# Patient Record
Sex: Male | Born: 1985 | Race: Black or African American | Hispanic: No | Marital: Single | Smoking: Current every day smoker
Health system: Southern US, Community
[De-identification: ages and names within clinical notes are randomized; demographics above are authoritative.]

## PROBLEM LIST (undated history)

## (undated) DIAGNOSIS — W3400XA Accidental discharge from unspecified firearms or gun, initial encounter: Secondary | ICD-10-CM

## (undated) HISTORY — PX: HAND SURGERY: SHX662

---

## 2006-03-13 ENCOUNTER — Emergency Department: Payer: Self-pay | Admitting: Internal Medicine

## 2007-03-17 ENCOUNTER — Emergency Department: Payer: Self-pay | Admitting: Emergency Medicine

## 2009-10-09 ENCOUNTER — Encounter (INDEPENDENT_AMBULATORY_CARE_PROVIDER_SITE_OTHER): Payer: Self-pay

## 2009-10-09 ENCOUNTER — Inpatient Hospital Stay (HOSPITAL_COMMUNITY): Admission: AC | Admit: 2009-10-09 | Discharge: 2009-10-28 | Payer: Self-pay | Source: Home / Self Care

## 2009-10-12 ENCOUNTER — Encounter (INDEPENDENT_AMBULATORY_CARE_PROVIDER_SITE_OTHER): Payer: Self-pay

## 2009-11-24 ENCOUNTER — Ambulatory Visit (HOSPITAL_COMMUNITY): Admission: RE | Admit: 2009-11-24 | Discharge: 2009-11-24 | Payer: Self-pay | Admitting: General Surgery

## 2010-08-01 LAB — CBC
HCT: 27.3 % — ABNORMAL LOW (ref 39.0–52.0)
HCT: 28.5 % — ABNORMAL LOW (ref 39.0–52.0)
HCT: 30.1 % — ABNORMAL LOW (ref 39.0–52.0)
HCT: 31.5 % — ABNORMAL LOW (ref 39.0–52.0)
HCT: 32 % — ABNORMAL LOW (ref 39.0–52.0)
HCT: 26.3 % — ABNORMAL LOW (ref 39.0–52.0)
HCT: 36.4 % — ABNORMAL LOW (ref 39.0–52.0)
HCT: 38.8 % — ABNORMAL LOW (ref 39.0–52.0)
HCT: 41.9 % (ref 39.0–52.0)
Hemoglobin: 10.4 g/dL — ABNORMAL LOW (ref 13.0–17.0)
Hemoglobin: 11 g/dL — ABNORMAL LOW (ref 13.0–17.0)
Hemoglobin: 11.1 g/dL — ABNORMAL LOW (ref 13.0–17.0)
Hemoglobin: 9.8 g/dL — ABNORMAL LOW (ref 13.0–17.0)
Hemoglobin: 9.8 g/dL — ABNORMAL LOW (ref 13.0–17.0)
Hemoglobin: 12.4 g/dL — ABNORMAL LOW (ref 13.0–17.0)
Hemoglobin: 13.4 g/dL (ref 13.0–17.0)
Hemoglobin: 14.6 g/dL (ref 13.0–17.0)
Hemoglobin: 8.8 g/dL — ABNORMAL LOW (ref 13.0–17.0)
Hemoglobin: 9.1 g/dL — ABNORMAL LOW (ref 13.0–17.0)
MCHC: 34.5 g/dL (ref 30.0–36.0)
MCHC: 34.6 g/dL (ref 30.0–36.0)
MCHC: 34.8 g/dL (ref 30.0–36.0)
MCHC: 35.9 g/dL (ref 30.0–36.0)
MCHC: 34.6 g/dL (ref 30.0–36.0)
MCHC: 34.8 g/dL (ref 30.0–36.0)
MCV: 90 fL (ref 78.0–100.0)
MCV: 89.8 fL (ref 78.0–100.0)
MCV: 90.1 fL (ref 78.0–100.0)
MCV: 90.4 fL (ref 78.0–100.0)
MCV: 91.4 fL (ref 78.0–100.0)
MCV: 91.5 fL (ref 78.0–100.0)
Platelets: 458 10*3/uL — ABNORMAL HIGH (ref 150–400)
Platelets: 94 10*3/uL — ABNORMAL LOW (ref 150–400)
RBC: 3.2 MIL/uL — ABNORMAL LOW (ref 4.22–5.81)
RBC: 3.41 MIL/uL — ABNORMAL LOW (ref 4.22–5.81)
RBC: 3.41 MIL/uL — ABNORMAL LOW (ref 4.22–5.81)
RBC: 3.65 MIL/uL — ABNORMAL LOW (ref 4.22–5.81)
RBC: 2.76 MIL/uL — ABNORMAL LOW (ref 4.22–5.81)
RBC: 2.91 MIL/uL — ABNORMAL LOW (ref 4.22–5.81)
RBC: 4.05 MIL/uL — ABNORMAL LOW (ref 4.22–5.81)
RBC: 4.58 MIL/uL (ref 4.22–5.81)
RDW: 13.3 % (ref 11.5–15.5)
RDW: 13.4 % (ref 11.5–15.5)
RDW: 13.4 % (ref 11.5–15.5)
RDW: 13.4 % (ref 11.5–15.5)
RDW: 13.5 % (ref 11.5–15.5)
RDW: 13.6 % (ref 11.5–15.5)
RDW: 13.2 % (ref 11.5–15.5)
RDW: 13.3 % (ref 11.5–15.5)
RDW: 14.1 % (ref 11.5–15.5)
WBC: 10.6 10*3/uL — ABNORMAL HIGH (ref 4.0–10.5)
WBC: 13.9 10*3/uL — ABNORMAL HIGH (ref 4.0–10.5)
WBC: 17.3 10*3/uL — ABNORMAL HIGH (ref 4.0–10.5)
WBC: 6 10*3/uL (ref 4.0–10.5)
WBC: 6.1 10*3/uL (ref 4.0–10.5)
WBC: 8.3 10*3/uL (ref 4.0–10.5)

## 2010-08-01 LAB — BASIC METABOLIC PANEL
BUN: 10 mg/dL (ref 6–23)
BUN: 14 mg/dL (ref 6–23)
BUN: 2 mg/dL — ABNORMAL LOW (ref 6–23)
BUN: 6 mg/dL (ref 6–23)
BUN: 8 mg/dL (ref 6–23)
CO2: 27 mEq/L (ref 19–32)
CO2: 27 mEq/L (ref 19–32)
CO2: 27 mEq/L (ref 19–32)
CO2: 28 mEq/L (ref 19–32)
CO2: 29 mEq/L (ref 19–32)
CO2: 23 mEq/L (ref 19–32)
CO2: 26 mEq/L (ref 19–32)
CO2: 28 mEq/L (ref 19–32)
Calcium: 8.7 mg/dL (ref 8.4–10.5)
Calcium: 8.8 mg/dL (ref 8.4–10.5)
Calcium: 8.9 mg/dL (ref 8.4–10.5)
Calcium: 8.9 mg/dL (ref 8.4–10.5)
Calcium: 8.9 mg/dL (ref 8.4–10.5)
Calcium: 8.9 mg/dL (ref 8.4–10.5)
Calcium: 9 mg/dL (ref 8.4–10.5)
Calcium: 6.9 mg/dL — ABNORMAL LOW (ref 8.4–10.5)
Calcium: 7.4 mg/dL — ABNORMAL LOW (ref 8.4–10.5)
Chloride: 96 mEq/L (ref 96–112)
Chloride: 105 mEq/L (ref 96–112)
Chloride: 105 mEq/L (ref 96–112)
Chloride: 107 mEq/L (ref 96–112)
Chloride: 108 mEq/L (ref 96–112)
Chloride: 113 mEq/L — ABNORMAL HIGH (ref 96–112)
Creatinine, Ser: 0.63 mg/dL (ref 0.4–1.5)
Creatinine, Ser: 0.65 mg/dL (ref 0.4–1.5)
Creatinine, Ser: 0.67 mg/dL (ref 0.4–1.5)
Creatinine, Ser: 0.67 mg/dL (ref 0.4–1.5)
Creatinine, Ser: 0.68 mg/dL (ref 0.4–1.5)
Creatinine, Ser: 0.68 mg/dL (ref 0.4–1.5)
Creatinine, Ser: 0.89 mg/dL (ref 0.4–1.5)
Creatinine, Ser: 1.09 mg/dL (ref 0.4–1.5)
GFR calc Af Amer: >60 mL/min (ref >60)
GFR calc Af Amer: >60 mL/min (ref >60)
GFR calc Af Amer: >60 mL/min (ref >60)
GFR calc Af Amer: >60 mL/min (ref >60)
GFR calc Af Amer: >60 mL/min (ref >60)
GFR calc Af Amer: >60 mL/min (ref >60)
GFR calc Af Amer: >60 mL/min (ref >60)
GFR calc Af Amer: >60 mL/min (ref >60)
GFR calc Af Amer: >60 mL/min (ref >60)
GFR calc non Af Amer: >60 mL/min (ref >60)
GFR calc non Af Amer: >60 mL/min (ref >60)
GFR calc non Af Amer: >60 mL/min (ref >60)
GFR calc non Af Amer: >60 mL/min (ref >60)
GFR calc non Af Amer: >60 mL/min (ref >60)
GFR calc non Af Amer: >60 mL/min (ref >60)
GFR calc non Af Amer: >60 mL/min (ref >60)
GFR calc non Af Amer: >60 mL/min (ref >60)
GFR calc non Af Amer: >60 mL/min (ref >60)
GFR calc non Af Amer: >60 mL/min (ref >60)
Glucose, Bld: 106 mg/dL — ABNORMAL HIGH (ref 70–99)
Glucose, Bld: 118 mg/dL — ABNORMAL HIGH (ref 70–99)
Glucose, Bld: 94 mg/dL (ref 70–99)
Glucose, Bld: 94 mg/dL (ref 70–99)
Glucose, Bld: 109 mg/dL — ABNORMAL HIGH (ref 70–99)
Glucose, Bld: 146 mg/dL — ABNORMAL HIGH (ref 70–99)
Glucose, Bld: 158 mg/dL — ABNORMAL HIGH (ref 70–99)
Potassium: 3.8 mEq/L (ref 3.5–5.1)
Potassium: 3.9 mEq/L (ref 3.5–5.1)
Potassium: 3.9 mEq/L (ref 3.5–5.1)
Potassium: 3.9 mEq/L (ref 3.5–5.1)
Potassium: 3.9 mEq/L (ref 3.5–5.1)
Potassium: 4.1 mEq/L (ref 3.5–5.1)
Potassium: 3.5 mEq/L (ref 3.5–5.1)
Potassium: 3.7 mEq/L (ref 3.5–5.1)
Potassium: 3.9 mEq/L (ref 3.5–5.1)
Potassium: 5.6 mEq/L — ABNORMAL HIGH (ref 3.5–5.1)
Sodium: 130 mEq/L — ABNORMAL LOW (ref 135–145)
Sodium: 131 mEq/L — ABNORMAL LOW (ref 135–145)
Sodium: 131 mEq/L — ABNORMAL LOW (ref 135–145)
Sodium: 132 mEq/L — ABNORMAL LOW (ref 135–145)
Sodium: 135 mEq/L (ref 135–145)
Sodium: 140 mEq/L (ref 135–145)
Sodium: 138 mEq/L (ref 135–145)
Sodium: 139 mEq/L (ref 135–145)
Sodium: 142 mEq/L (ref 135–145)
Sodium: 142 mEq/L (ref 135–145)
Sodium: 148 mEq/L — ABNORMAL HIGH (ref 135–145)

## 2010-08-01 LAB — ANAEROBIC CULTURE

## 2010-08-01 LAB — POCT I-STAT 3, ART BLOOD GAS (G3+)
Acid-base deficit: 2 mmol/L (ref 0.0–2.0)
Bicarbonate: 24.3 meq/L — ABNORMAL HIGH (ref 20.0–24.0)
Bicarbonate: 24.3 meq/L — ABNORMAL HIGH (ref 20.0–24.0)
Patient temperature: 97.4
Patient temperature: 99.6
TCO2: 26 mmol/L (ref 0–100)
TCO2: 26 mmol/L (ref 0–100)
pH, Arterial: 7.289 — ABNORMAL LOW (ref 7.350–7.450)
pH, Arterial: 7.349 — ABNORMAL LOW (ref 7.350–7.450)
pO2, Arterial: 163 mmHg — ABNORMAL HIGH (ref 80.0–100.0)
pO2, Arterial: 176 mmHg — ABNORMAL HIGH (ref 80.0–100.0)

## 2010-08-01 LAB — GLUCOSE, CAPILLARY
Glucose-Capillary: 124 mg/dL — ABNORMAL HIGH (ref 70–99)
Glucose-Capillary: 125 mg/dL — ABNORMAL HIGH (ref 70–99)
Glucose-Capillary: 134 mg/dL — ABNORMAL HIGH (ref 70–99)
Glucose-Capillary: 134 mg/dL — ABNORMAL HIGH (ref 70–99)
Glucose-Capillary: 137 mg/dL — ABNORMAL HIGH (ref 70–99)
Glucose-Capillary: 139 mg/dL — ABNORMAL HIGH (ref 70–99)
Glucose-Capillary: 140 mg/dL — ABNORMAL HIGH (ref 70–99)

## 2010-08-01 LAB — PREPARE FRESH FROZEN PLASMA

## 2010-08-01 LAB — TYPE AND SCREEN: ABO/RH(D): O POS

## 2010-08-01 LAB — COMPREHENSIVE METABOLIC PANEL
ALT: 38 U/L (ref 0–53)
AST: 59 U/L — ABNORMAL HIGH (ref 0–37)
AST: 29 U/L (ref 0–37)
Albumin: 2.9 g/dL — ABNORMAL LOW (ref 3.5–5.2)
Alkaline Phosphatase: 130 U/L — ABNORMAL HIGH (ref 39–117)
Alkaline Phosphatase: 139 U/L — ABNORMAL HIGH (ref 39–117)
Alkaline Phosphatase: 82 U/L (ref 39–117)
BUN: 12 mg/dL (ref 6–23)
BUN: 13 mg/dL (ref 6–23)
CO2: 27 mEq/L (ref 19–32)
CO2: 28 mEq/L (ref 19–32)
CO2: 29 mEq/L (ref 19–32)
CO2: 18 mEq/L — ABNORMAL LOW (ref 19–32)
Calcium: 8.9 mg/dL (ref 8.4–10.5)
Chloride: 94 mEq/L — ABNORMAL LOW (ref 96–112)
Chloride: 112 mEq/L (ref 96–112)
Creatinine, Ser: 1.43 mg/dL (ref 0.4–1.5)
GFR calc Af Amer: >60 mL/min (ref >60)
GFR calc Af Amer: >60 mL/min (ref >60)
GFR calc non Af Amer: >60 mL/min (ref >60)
GFR calc non Af Amer: >60 mL/min (ref >60)
GFR calc non Af Amer: >60 mL/min (ref >60)
Glucose, Bld: 103 mg/dL — ABNORMAL HIGH (ref 70–99)
Glucose, Bld: 116 mg/dL — ABNORMAL HIGH (ref 70–99)
Glucose, Bld: 118 mg/dL — ABNORMAL HIGH (ref 70–99)
Glucose, Bld: 215 mg/dL — ABNORMAL HIGH (ref 70–99)
Potassium: 3.9 mEq/L (ref 3.5–5.1)
Potassium: 4 mEq/L (ref 3.5–5.1)
Sodium: 132 mEq/L — ABNORMAL LOW (ref 135–145)
Total Bilirubin: 0.5 mg/dL (ref 0.3–1.2)
Total Bilirubin: 0.9 mg/dL (ref 0.3–1.2)
Total Bilirubin: 1.1 mg/dL (ref 0.3–1.2)
Total Bilirubin: 0.4 mg/dL (ref 0.3–1.2)
Total Protein: 7.1 g/dL (ref 6.0–8.3)

## 2010-08-01 LAB — PREPARE PLATELETS

## 2010-08-01 LAB — POCT I-STAT, CHEM 8
Chloride: 104 meq/L (ref 96–112)
Chloride: 111 meq/L (ref 96–112)
Creatinine, Ser: 1.5 mg/dL (ref 0.4–1.5)
Glucose, Bld: 218 mg/dL — ABNORMAL HIGH (ref 70–99)
HCT: 38 % — ABNORMAL LOW (ref 39.0–52.0)
Hemoglobin: 12.9 g/dL — ABNORMAL LOW (ref 13.0–17.0)
Hemoglobin: 12.9 g/dL — ABNORMAL LOW (ref 13.0–17.0)
Potassium: 3.2 meq/L — ABNORMAL LOW (ref 3.5–5.1)
Potassium: 4.7 meq/L (ref 3.5–5.1)
Sodium: 141 meq/L (ref 135–145)

## 2010-08-01 LAB — CROSSMATCH
ABO/RH(D): O POS
Antibody Screen: NEGATIVE

## 2010-08-01 LAB — DIFFERENTIAL
Basophils Absolute: 0 10*3/uL (ref 0.0–0.1)
Basophils Absolute: 0 10*3/uL (ref 0.0–0.1)
Basophils Relative: 0 % (ref 0–1)
Basophils Relative: 0 % (ref 0–1)
Eosinophils Absolute: 0.2 10*3/uL (ref 0.0–0.7)
Eosinophils Absolute: 0.2 10*3/uL (ref 0.0–0.7)
Eosinophils Absolute: 0 10*3/uL (ref 0.0–0.7)
Eosinophils Relative: 2 % (ref 0–5)
Eosinophils Relative: 0 % (ref 0–5)
Lymphocytes Relative: 16 % (ref 12–46)
Lymphs Abs: 1.6 10*3/uL (ref 0.7–4.0)
Lymphs Abs: 1.7 10*3/uL (ref 0.7–4.0)
Monocytes Absolute: 1.6 10*3/uL — ABNORMAL HIGH (ref 0.1–1.0)
Monocytes Absolute: 0.5 10*3/uL (ref 0.1–1.0)
Monocytes Relative: 11 % (ref 3–12)
Monocytes Relative: 15 % — ABNORMAL HIGH (ref 3–12)
Monocytes Relative: 17 % — ABNORMAL HIGH (ref 3–12)
Monocytes Relative: 8 % (ref 3–12)
Neutro Abs: 10.5 10*3/uL — ABNORMAL HIGH (ref 1.7–7.7)
Neutro Abs: 5.7 10*3/uL (ref 1.7–7.7)
Neutro Abs: 7 10*3/uL (ref 1.7–7.7)
Neutrophils Relative %: 65 % (ref 43–77)
Neutrophils Relative %: 66 % (ref 43–77)

## 2010-08-01 LAB — POCT I-STAT 7, (LYTES, BLD GAS, ICA,H+H)
Bicarbonate: 12 meq/L — ABNORMAL LOW (ref 20.0–24.0)
Calcium, Ion: 0.91 mmol/L — ABNORMAL LOW (ref 1.12–1.32)
HCT: 21 % — ABNORMAL LOW (ref 39.0–52.0)
HCT: 31 % — ABNORMAL LOW (ref 39.0–52.0)
Hemoglobin: 10.5 g/dL — ABNORMAL LOW (ref 13.0–17.0)
Hemoglobin: 7.1 g/dL — ABNORMAL LOW (ref 13.0–17.0)
Patient temperature: 35
Potassium: 3.5 meq/L (ref 3.5–5.1)
Sodium: 146 meq/L — ABNORMAL HIGH (ref 135–145)
pCO2 arterial: 35.6 mmHg (ref 35.0–45.0)
pH, Arterial: 6.968 — CL (ref 7.350–7.450)
pO2, Arterial: 508 mmHg — ABNORMAL HIGH (ref 80.0–100.0)

## 2010-08-01 LAB — MAGNESIUM
Magnesium: 2 mg/dL (ref 1.5–2.5)
Magnesium: 2.1 mg/dL (ref 1.5–2.5)
Magnesium: 2.2 mg/dL (ref 1.5–2.5)

## 2010-08-01 LAB — WOUND CULTURE

## 2010-08-01 LAB — PROTIME-INR
INR: 1.44 (ref 0.00–1.49)
Prothrombin Time: 15.9 seconds — ABNORMAL HIGH (ref 11.6–15.2)
Prothrombin Time: 17.4 seconds — ABNORMAL HIGH (ref 11.6–15.2)

## 2010-08-01 LAB — PHOSPHORUS
Phosphorus: 4.2 mg/dL (ref 2.3–4.6)
Phosphorus: 4.4 mg/dL (ref 2.3–4.6)

## 2010-08-01 LAB — TRIGLYCERIDES
Triglycerides: 108 mg/dL (ref <150)
Triglycerides: 173 mg/dL — ABNORMAL HIGH (ref <150)

## 2010-08-01 LAB — LACTIC ACID, PLASMA
Lactic Acid, Venous: 1.7 mmol/L (ref 0.5–2.2)
Lactic Acid, Venous: 2.5 mmol/L — ABNORMAL HIGH (ref 0.5–2.2)

## 2010-08-01 LAB — APTT
aPTT: 25 seconds (ref 24–37)
aPTT: 33 seconds (ref 24–37)

## 2010-08-01 LAB — CULTURE, ROUTINE-ABSCESS
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
## 2011-06-03 IMAGING — XA IR FISTULA/SINUS TRACT
1 series · 11 of 11 positions shown · non-contrast
Comparison: None.

CLINICAL DATA: Right lower quadrant abscess drain

SINUS TRACT INJECTION/FISTULOGRAM
TECHNIQUE: Contrast was injected into the abscess drain and images
were obtained.

[Series 1: run · 11 of 11 slices shown]
[im 1/11]
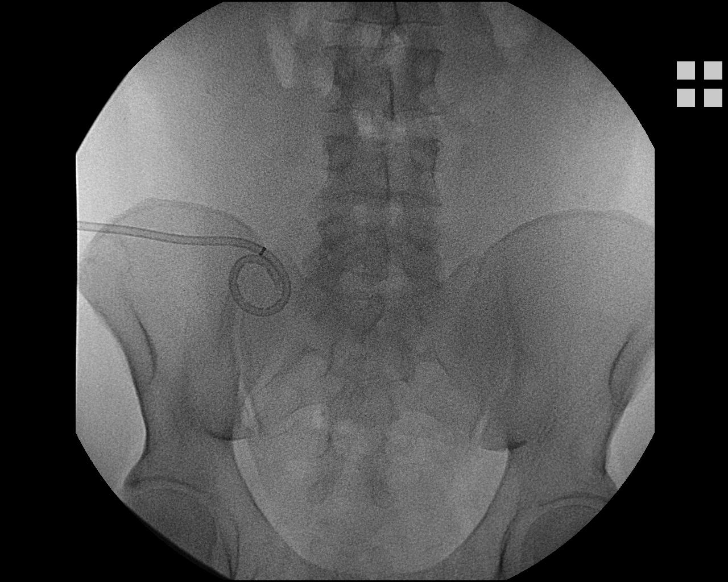
[im 2/11]
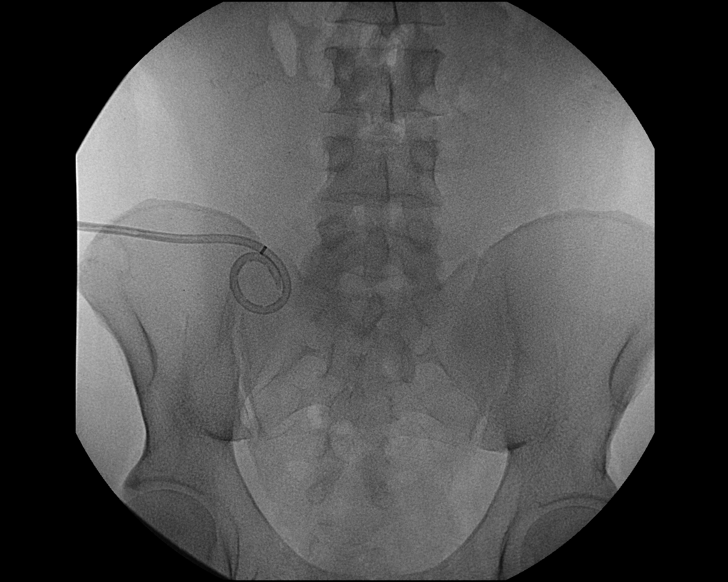
[im 3/11]
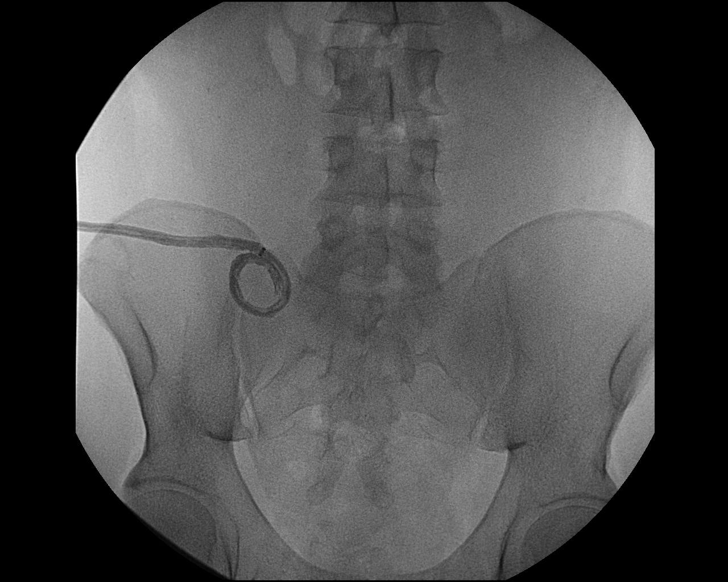
[im 4/11]
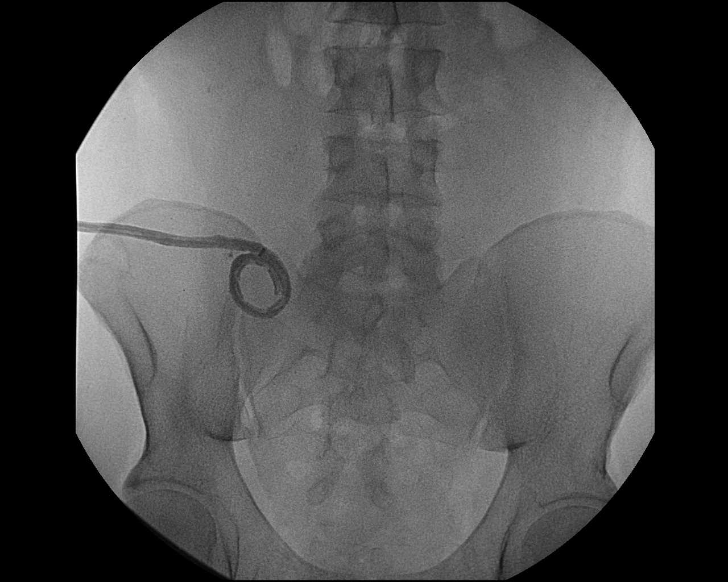
[im 5/11]
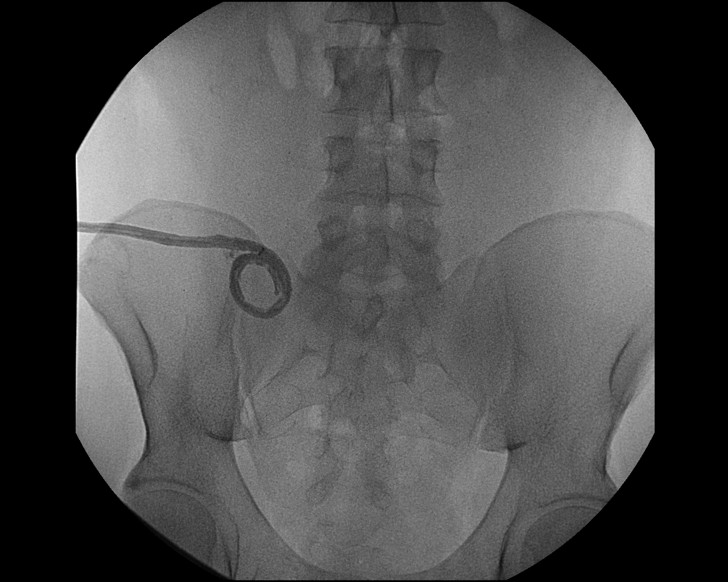
[im 6/11]
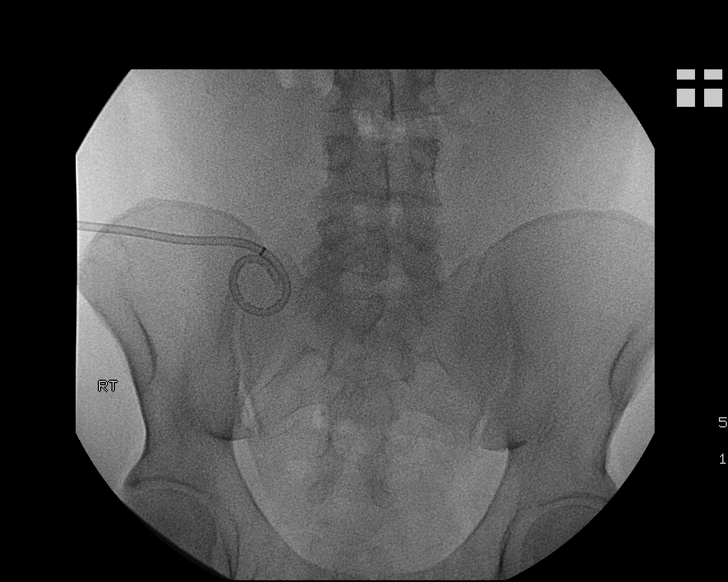
[im 7/11]
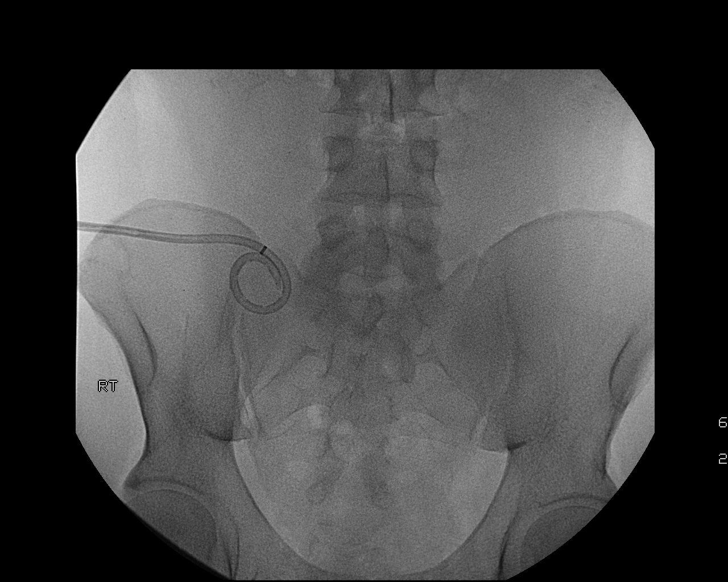
[im 8/11]
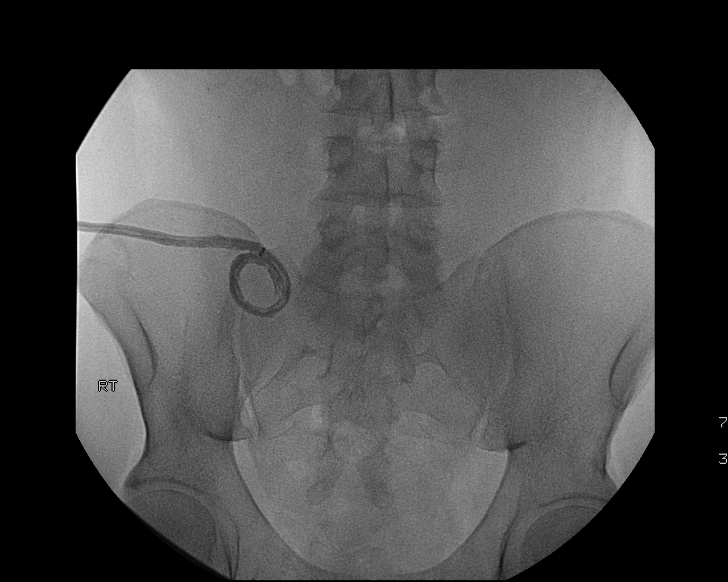
[im 9/11]
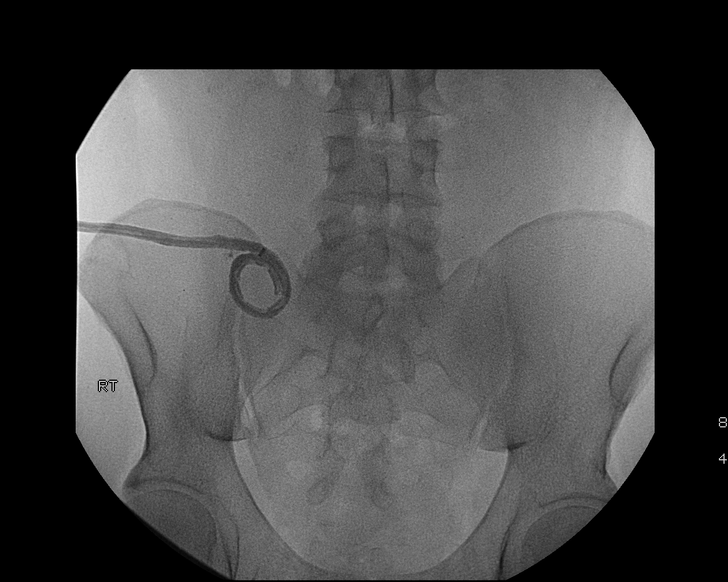
[im 10/11]
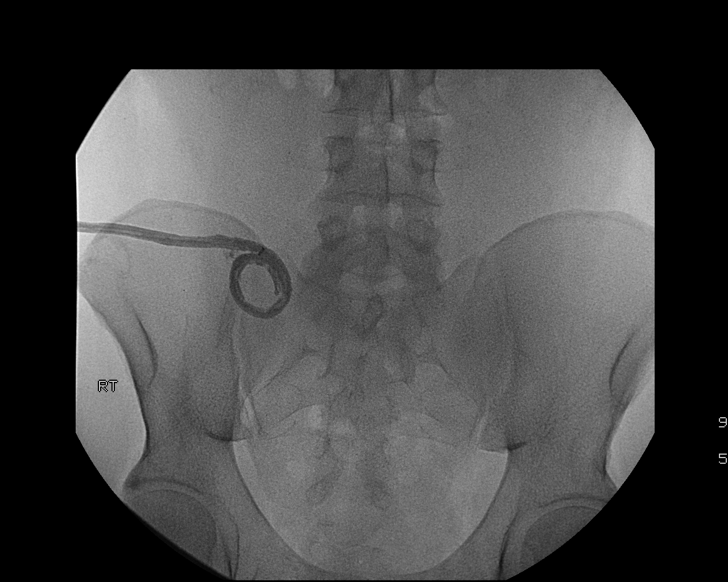
[im 11/11]
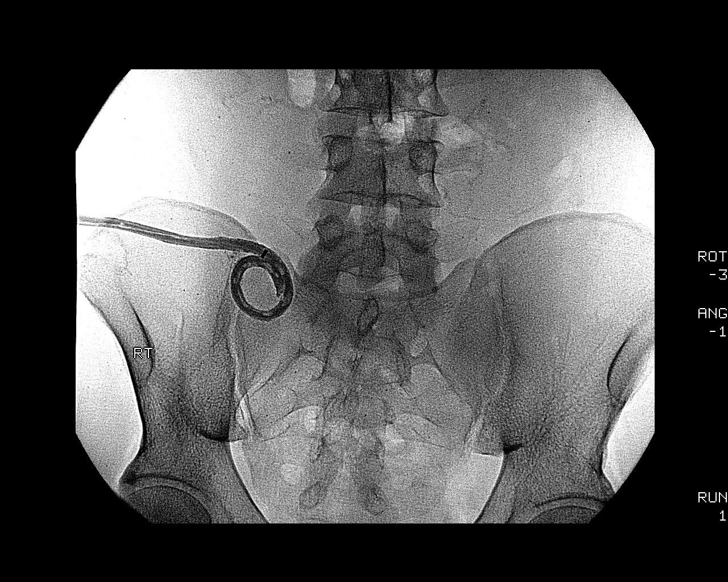

[11 of 11 positions shown; findings below may reference images not displayed]

FINDINGS: The abscess cavity is completely decompressed.  There is
no evidence of fistula.
IMPRESSION: Abscess cavity is completely decompressed and there is no evidence
of fistula.  Some cloudy discharge in the drain bag was noted
therefore the drain was not removed at this time.

## 2013-02-01 ENCOUNTER — Emergency Department: Payer: Self-pay | Admitting: Emergency Medicine

## 2013-02-01 LAB — CBC
HCT: 44.6 % (ref 40.0–52.0)
HGB: 14.9 g/dL (ref 13.0–18.0)
MCH: 30.1 pg (ref 26.0–34.0)
MCHC: 33.5 g/dL (ref 32.0–36.0)
MCV: 90 fL (ref 80–100)
Platelet: 234 10*3/uL (ref 150–440)
RBC: 4.97 10*6/uL (ref 4.40–5.90)
WBC: 9.6 10*3/uL (ref 3.8–10.6)

## 2013-02-01 LAB — BASIC METABOLIC PANEL
Anion Gap: 11 (ref 7–16)
BUN: 13 mg/dL (ref 7–18)
Creatinine: 1.06 mg/dL (ref 0.60–1.30)
EGFR (African American): >60
EGFR (Non-African Amer.): >60
Osmolality: 282 (ref 275–301)

## 2013-09-02 ENCOUNTER — Emergency Department: Payer: Self-pay | Admitting: Emergency Medicine

## 2014-02-01 ENCOUNTER — Emergency Department: Payer: Self-pay | Admitting: Emergency Medicine

## 2014-02-01 LAB — CBC
HCT: 45.4 % (ref 40.0–52.0)
HGB: 14.9 g/dL (ref 13.0–18.0)
MCH: 29.5 pg (ref 26.0–34.0)
MCHC: 32.8 g/dL (ref 32.0–36.0)
MCV: 90 fL (ref 80–100)
PLATELETS: 201 10*3/uL (ref 150–440)
RBC: 5.05 10*6/uL (ref 4.40–5.90)
RDW: 13.1 % (ref 11.5–14.5)
WBC: 7.5 10*3/uL (ref 3.8–10.6)

## 2014-02-02 LAB — BASIC METABOLIC PANEL
Anion Gap: 13 (ref 7–16)
BUN: 10 mg/dL (ref 7–18)
CHLORIDE: 104 mmol/L (ref 98–107)
CO2: 22 mmol/L (ref 21–32)
CREATININE: 0.98 mg/dL (ref 0.60–1.30)
Calcium, Total: 8.7 mg/dL (ref 8.5–10.1)
EGFR (Non-African Amer.): >60
GLUCOSE: 76 mg/dL (ref 65–99)
OSMOLALITY: 275 (ref 275–301)
Potassium: 3.5 mmol/L (ref 3.5–5.1)
SODIUM: 139 mmol/L (ref 136–145)

## 2014-02-13 ENCOUNTER — Emergency Department: Payer: Self-pay | Admitting: Emergency Medicine

## 2014-10-30 ENCOUNTER — Encounter: Payer: Self-pay | Admitting: Emergency Medicine

## 2014-10-30 ENCOUNTER — Emergency Department
Admission: EM | Admit: 2014-10-30 | Discharge: 2014-10-30 | Disposition: A | Discharge status: Home / Self Care | Payer: Self-pay | Attending: Emergency Medicine | Admitting: Emergency Medicine

## 2014-10-30 DIAGNOSIS — M25442 Effusion, left hand: Secondary | ICD-10-CM | POA: Insufficient documentation

## 2014-10-30 DIAGNOSIS — L02512 Cutaneous abscess of left hand: Secondary | ICD-10-CM

## 2014-10-30 DIAGNOSIS — Z87828 Personal history of other (healed) physical injury and trauma: Secondary | ICD-10-CM | POA: Insufficient documentation

## 2014-10-30 MED ORDER — SULFAMETHOXAZOLE-TRIMETHOPRIM 800-160 MG PO TABS: 1.0000 | ORAL_TABLET | Freq: Two times a day (BID) | ORAL | Status: DC

## 2014-10-30 MED ORDER — LIDOCAINE HCL (PF) 1 % IJ SOLN
INTRAMUSCULAR | Status: AC
  Administered 2014-10-30: 2.1 mL
  Filled 2014-10-30: qty 5

## 2014-10-30 MED ORDER — IBUPROFEN 800 MG PO TABS: 800.0000 mg | ORAL_TABLET | Freq: Three times a day (TID) | ORAL | Status: DC | PRN

## 2014-10-30 MED ORDER — CEFTRIAXONE SODIUM 1 G IJ SOLR
1.0000 g | Freq: Once | INTRAMUSCULAR | Status: AC
  Administered 2014-10-30: 1 g via INTRAMUSCULAR

## 2014-10-30 MED ORDER — ONDANSETRON 8 MG PO TBDP
ORAL_TABLET | ORAL | Status: AC
  Filled 2014-10-30: qty 1

## 2014-10-30 MED ORDER — IBUPROFEN 800 MG PO TABS
800.0000 mg | ORAL_TABLET | Freq: Once | ORAL | Status: AC
  Administered 2014-10-30: 800 mg via ORAL

## 2014-10-30 MED ORDER — CEFTRIAXONE SODIUM 1 G IJ SOLR
INTRAMUSCULAR | Status: AC
  Administered 2014-10-30: 1 g via INTRAMUSCULAR
  Filled 2014-10-30: qty 10

## 2014-10-30 MED ORDER — IBUPROFEN 800 MG PO TABS
ORAL_TABLET | ORAL | Status: AC
  Filled 2014-10-30: qty 1

## 2014-10-30 NOTE — ED Notes (Addendum)
Pt reports pain and swelling to third digit on left hand.  Pt reports surgery to finger 3 years ago.  Pt denies any recent injury.  Swelling noted to middle knuckle.  Pt reports pus coming from scabbed area on finger.  Pt NAD at this time.
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
## 2014-10-30 NOTE — Discharge Instructions (Signed)
Abscess °An abscess (boil or furuncle) is an infected area on or under the skin. This area is filled with yellowish-white fluid (pus) and other material (debris). °HOME CARE  °· Only take medicines as told by your doctor. °· If you were given antibiotic medicine, take it as directed. Finish the medicine even if you start to feel better. °· If gauze is used, follow your doctor's directions for changing the gauze. °· To avoid spreading the infection: °· Keep your abscess covered with a bandage. °· Wash your hands well. °· Do not share personal care items, towels, or whirlpools with others. °· Avoid skin contact with others. °· Keep your skin and clothes clean around the abscess. °· Keep all doctor visits as told. °GET HELP RIGHT AWAY IF:  °· You have more pain, puffiness (swelling), or redness in the wound site. °· You have more fluid or blood coming from the wound site. °· You have muscle aches, chills, or you feel sick. °· You have a fever. °MAKE SURE YOU:  °· Understand these instructions. °· Will watch your condition. °· Will get help right away if you are not doing well or get worse. °Document Released: 10/18/2007 Document Revised: 10/31/2011 Document Reviewed: 07/14/2011 °ExitCare® Patient Information ©2015 [employer]. This information is not intended to replace advice given to you by your health care provider. Make sure you discuss any questions you have with your health care provider. ° °Cellulitis °Cellulitis is an infection of the skin and the tissue under the skin. The infected area is usually red and tender. This happens most often in the arms and lower legs. °HOME CARE  °· Take your antibiotic medicine as told. Finish the medicine even if you start to feel better. °· Keep the infected arm or leg raised (elevated). °· Put a warm cloth on the area up to 4 times per day. °· Only take medicines as told by your doctor. °· Keep all doctor visits as told. °GET HELP IF: °· You see red streaks on the skin  coming from the infected area. °· Your red area gets bigger or turns a dark color. °· Your bone or joint under the infected area is painful after the skin heals. °· Your infection comes back in the same area or different area. °· You have a puffy (swollen) bump in the infected area. °· You have new symptoms. °· You have a fever. °GET HELP RIGHT AWAY IF:  °· You feel very sleepy. °· You throw up (vomit) or have watery poop (diarrhea). °· You feel sick and have muscle aches and pains. °MAKE SURE YOU:  °· Understand these instructions. °· Will watch your condition. °· Will get help right away if you are not doing well or get worse. °Document Released: 10/18/2007 Document Revised: 09/15/2013 Document Reviewed: 07/17/2011 °ExitCare® Patient Information ©2015 [employer]. This information is not intended to replace advice given to you by your health care provider. Make sure you discuss any questions you have with your health care provider. ° °

## 2014-11-04 ENCOUNTER — Telehealth: Payer: Self-pay | Admitting: Emergency Medicine

## 2014-11-04 NOTE — ED Notes (Signed)
Per dr Jill Alexanders patient to encourage follow up with ortho.  i called patient and explained and gave him on call ortho for visit date-dr Hyacinth Meeker and phone number.  Pt says the finger is looking better.

## 2015-01-21 ENCOUNTER — Emergency Department
Admission: EM | Admit: 2015-01-21 | Discharge: 2015-01-21 | Disposition: A | Discharge status: Home / Self Care | Payer: Self-pay | Attending: Student | Admitting: Student

## 2015-01-21 ENCOUNTER — Encounter: Payer: Self-pay | Admitting: Emergency Medicine

## 2015-01-21 DIAGNOSIS — L03114 Cellulitis of left upper limb: Secondary | ICD-10-CM | POA: Insufficient documentation

## 2015-01-21 DIAGNOSIS — L03012 Cellulitis of left finger: Secondary | ICD-10-CM

## 2015-01-21 HISTORY — DX: Accidental discharge from unspecified firearms or gun, initial encounter: W34.00XA

## 2015-01-21 MED ORDER — SULFAMETHOXAZOLE-TRIMETHOPRIM 800-160 MG PO TABS: 1.0000 | ORAL_TABLET | Freq: Two times a day (BID) | ORAL | Status: DC
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
## 2015-01-21 NOTE — Discharge Instructions (Signed)
Cellulitis Cellulitis is an infection of the skin and the tissue under the skin. The infected area is usually red and tender. This happens most often in the arms and lower legs. HOME CARE   Take your antibiotic medicine as told. Finish the medicine even if you start to feel better.  Keep the infected arm or leg raised (elevated).  Put a warm cloth on the area up to 4 times per day.  Only take medicines as told by your doctor.  Keep all doctor visits as told. GET HELP IF:  You see red streaks on the skin coming from the infected area.  Your red area gets bigger or turns a dark color.  Your bone or joint under the infected area is painful after the skin heals.  Your infection comes back in the same area or different area.  You have a puffy (swollen) bump in the infected area.  You have new symptoms.  You have a fever. GET HELP RIGHT AWAY IF:   You feel very sleepy.  You throw up (vomit) or have watery poop (diarrhea).  You feel sick and have muscle aches and pains. MAKE SURE YOU:   Understand these instructions.  Will watch your condition.  Will get help right away if you are not doing well or get worse. Document Released: 10/18/2007 Document Revised: 09/15/2013 Document Reviewed: 07/17/2011 [facility] Patient Information 2015 [place], [place]. This information is not intended to replace advice given to you by your health care provider. Make sure you discuss any questions you have with your health care provider.  Take the antibiotic as directed. Follow-up with [employer] as needed.
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
## 2015-05-01 ENCOUNTER — Encounter: Payer: Self-pay | Admitting: *Deleted

## 2015-05-01 ENCOUNTER — Emergency Department
Admission: EM | Admit: 2015-05-01 | Discharge: 2015-05-01 | Disposition: A | Discharge status: Home / Self Care | Payer: Self-pay | Attending: Emergency Medicine | Admitting: Emergency Medicine

## 2015-05-01 DIAGNOSIS — L089 Local infection of the skin and subcutaneous tissue, unspecified: Secondary | ICD-10-CM | POA: Insufficient documentation

## 2015-05-01 DIAGNOSIS — Z79899 Other long term (current) drug therapy: Secondary | ICD-10-CM | POA: Insufficient documentation

## 2015-05-01 DIAGNOSIS — F17210 Nicotine dependence, cigarettes, uncomplicated: Secondary | ICD-10-CM | POA: Insufficient documentation

## 2015-05-01 MED ORDER — CLINDAMYCIN HCL 300 MG PO CAPS: 300.0000 mg | ORAL_CAPSULE | Freq: Three times a day (TID) | ORAL | Status: DC

## 2015-05-01 MED ORDER — CLINDAMYCIN HCL 150 MG PO CAPS
300.0000 mg | ORAL_CAPSULE | Freq: Once | ORAL | Status: AC
  Administered 2015-05-01: 300 mg via ORAL
  Filled 2015-05-01: qty 2
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
## 2015-09-29 DIAGNOSIS — W57XXXA Bitten or stung by nonvenomous insect and other nonvenomous arthropods, initial encounter: Secondary | ICD-10-CM | POA: Insufficient documentation

## 2015-09-29 DIAGNOSIS — Y929 Unspecified place or not applicable: Secondary | ICD-10-CM | POA: Insufficient documentation

## 2015-09-29 DIAGNOSIS — S30862A Insect bite (nonvenomous) of penis, initial encounter: Secondary | ICD-10-CM | POA: Insufficient documentation

## 2015-09-29 DIAGNOSIS — Y939 Activity, unspecified: Secondary | ICD-10-CM | POA: Insufficient documentation

## 2015-09-29 DIAGNOSIS — F17210 Nicotine dependence, cigarettes, uncomplicated: Secondary | ICD-10-CM | POA: Insufficient documentation

## 2015-09-29 DIAGNOSIS — Y999 Unspecified external cause status: Secondary | ICD-10-CM | POA: Insufficient documentation

## 2015-09-29 DIAGNOSIS — Z792 Long term (current) use of antibiotics: Secondary | ICD-10-CM | POA: Insufficient documentation
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
## 2015-09-30 ENCOUNTER — Emergency Department
Admission: EM | Admit: 2015-09-30 | Discharge: 2015-09-30 | Disposition: A | Discharge status: Home / Self Care | Payer: Self-pay | Attending: Emergency Medicine | Admitting: Emergency Medicine

## 2015-09-30 DIAGNOSIS — W57XXXA Bitten or stung by nonvenomous insect and other nonvenomous arthropods, initial encounter: Secondary | ICD-10-CM

## 2015-09-30 MED ORDER — BACITRACIN ZINC 500 UNIT/GM EX OINT
TOPICAL_OINTMENT | Freq: Once | CUTANEOUS | Status: AC
  Administered 2015-09-30: 03:00:00 via TOPICAL
  Filled 2015-09-30: qty 0.9
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
## 2015-09-30 NOTE — Discharge Instructions (Signed)
Return to the ER for increased swelling, redness, headache, neck pain, fever, rash or other concerns.  Tick Bite Information Ticks are insects that attach themselves to the skin and draw blood for food. There are various types of ticks. Common types include wood ticks and deer ticks. Most ticks live in shrubs and grassy areas. Ticks can climb onto your body when you make contact with leaves or grass where the tick is waiting. The most common places on the body for ticks to attach themselves are the scalp, neck, armpits, waist, and groin. Most tick bites are harmless, but sometimes ticks carry germs that cause diseases. These germs can be spread to a person during the tick's feeding process. The chance of a disease spreading through a tick bite depends on:   The type of tick.  Time of year.   How long the tick is attached.   Geographic location.  HOW CAN YOU PREVENT TICK BITES? Take these steps to help prevent tick bites when you are outdoors:  Wear protective clothing. Long sleeves and long pants are best.   Wear white clothes so you can see ticks more easily.  Tuck your pant legs into your socks.   If walking on a trail, stay in the middle of the trail to avoid brushing against bushes.  Avoid walking through areas with long grass.  Put insect repellent on all exposed skin and along boot tops, pant legs, and sleeve cuffs.   Check clothing, hair, and skin repeatedly and before going inside.   Brush off any ticks that are not attached.  Take a shower or bath as soon as possible after being outdoors.  WHAT IS THE PROPER WAY TO REMOVE A TICK? Ticks should be removed as soon as possible to help prevent diseases caused by tick bites. 1. If latex gloves are available, put them on before trying to remove a tick.  2. Using fine-point tweezers, grasp the tick as close to the skin as possible. You may also use curved forceps or a tick removal tool. Grasp the tick as close to its  head as possible. Avoid grasping the tick on its body. 3. Pull gently with steady upward pressure until the tick lets go. Do not twist the tick or jerk it suddenly. This may break off the tick's head or mouth parts. 4. Do not squeeze or crush the tick's body. This could force disease-carrying fluids from the tick into your body.  5. After the tick is removed, wash the bite area and your hands with soap and water or other disinfectant such as alcohol. 6. Apply a small amount of antiseptic cream or ointment to the bite site.  7. Wash and disinfect any instruments that were used.  Do not try to remove a tick by applying a hot match, petroleum jelly, or fingernail polish to the tick. These methods do not work and may increase the chances of disease being spread from the tick bite.  WHEN SHOULD YOU SEEK MEDICAL CARE? Contact your health care provider if you are unable to remove a tick from your skin or if a part of the tick breaks off and is stuck in the skin.  After a tick bite, you need to be aware of signs and symptoms that could be related to diseases spread by ticks. Contact your health care provider if you develop any of the following in the days or weeks after the tick bite:  Unexplained fever.  Rash. A circular rash that appears days  or weeks after the tick bite may indicate the possibility of Lyme disease. The rash may resemble a target with a bull's-eye and may occur at a different part of your body than the tick bite.  Redness and swelling in the area of the tick bite.   Tender, swollen lymph glands.   Diarrhea.   Weight loss.   Cough.   Fatigue.   Muscle, joint, or bone pain.   Abdominal pain.   Headache.   Lethargy or a change in your level of consciousness.  Difficulty walking or moving your legs.   Numbness in the legs.   Paralysis.  Shortness of breath.   Confusion.   Repeated vomiting.    This information is not intended to replace advice  given to you by your health care provider. Make sure you discuss any questions you have with your health care provider.   Document Released: 04/28/2000 Document Revised: 05/22/2014 Document Reviewed: 10/09/2012 Elsevier Interactive Patient Education [employer]

## 2017-10-24 ENCOUNTER — Encounter: Payer: Self-pay | Admitting: Emergency Medicine

## 2017-10-24 ENCOUNTER — Emergency Department
Admission: EM | Admit: 2017-10-24 | Discharge: 2017-10-24 | Disposition: A | Discharge status: Home / Self Care | Payer: 59 | Attending: Emergency Medicine | Admitting: Emergency Medicine

## 2017-10-24 DIAGNOSIS — Y9389 Activity, other specified: Secondary | ICD-10-CM | POA: Insufficient documentation

## 2017-10-24 DIAGNOSIS — W57XXXA Bitten or stung by nonvenomous insect and other nonvenomous arthropods, initial encounter: Secondary | ICD-10-CM | POA: Insufficient documentation

## 2017-10-24 DIAGNOSIS — S30861A Insect bite (nonvenomous) of abdominal wall, initial encounter: Secondary | ICD-10-CM | POA: Insufficient documentation

## 2017-10-24 DIAGNOSIS — F17210 Nicotine dependence, cigarettes, uncomplicated: Secondary | ICD-10-CM | POA: Insufficient documentation

## 2017-10-24 DIAGNOSIS — Y998 Other external cause status: Secondary | ICD-10-CM | POA: Diagnosis not present

## 2017-10-24 DIAGNOSIS — Y929 Unspecified place or not applicable: Secondary | ICD-10-CM | POA: Insufficient documentation

## 2017-10-24 DIAGNOSIS — L089 Local infection of the skin and subcutaneous tissue, unspecified: Secondary | ICD-10-CM | POA: Diagnosis not present

## 2017-10-24 MED ORDER — NAPROXEN 500 MG PO TABS: 500.0000 mg | ORAL_TABLET | Freq: Two times a day (BID) | ORAL | Status: DC

## 2017-10-24 MED ORDER — NAPROXEN 500 MG PO TABS
500.0000 mg | ORAL_TABLET | Freq: Once | ORAL | Status: AC
  Administered 2017-10-24: 500 mg via ORAL
  Filled 2017-10-24: qty 1

## 2017-10-24 MED ORDER — SULFAMETHOXAZOLE-TRIMETHOPRIM 800-160 MG PO TABS
1.0000 | ORAL_TABLET | Freq: Once | ORAL | Status: AC
  Administered 2017-10-24: 1 via ORAL
  Filled 2017-10-24: qty 1

## 2017-10-24 MED ORDER — SULFAMETHOXAZOLE-TRIMETHOPRIM 800-160 MG PO TABS: 1.0000 | ORAL_TABLET | Freq: Two times a day (BID) | ORAL | 0 refills | Status: DC

## 2017-10-24 NOTE — Discharge Instructions (Signed)
Keep area covered to avoid irritation from clothing.  Follow discharge care instruction take medication as directed.
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
## 2017-10-24 NOTE — ED Triage Notes (Signed)
Pt arrived with concerns over possible spider bite on his stomach. Small raised area near [employer].

## 2017-10-24 NOTE — ED Notes (Signed)
See triage note  Presents with possible spider bite to abd  Has small red area to left side of abd

## 2019-03-31 ENCOUNTER — Emergency Department
Admission: EM | Admit: 2019-03-31 | Discharge: 2019-03-31 | Disposition: A | Discharge status: Home / Self Care | Payer: Self-pay | Attending: Emergency Medicine | Admitting: Emergency Medicine

## 2019-03-31 ENCOUNTER — Emergency Department: Payer: Self-pay

## 2019-03-31 ENCOUNTER — Encounter: Payer: Self-pay | Admitting: Emergency Medicine

## 2019-03-31 ENCOUNTER — Other Ambulatory Visit: Payer: Self-pay

## 2019-03-31 DIAGNOSIS — R52 Pain, unspecified: Secondary | ICD-10-CM

## 2019-03-31 DIAGNOSIS — F17210 Nicotine dependence, cigarettes, uncomplicated: Secondary | ICD-10-CM | POA: Insufficient documentation

## 2019-03-31 DIAGNOSIS — R030 Elevated blood-pressure reading, without diagnosis of hypertension: Secondary | ICD-10-CM | POA: Insufficient documentation

## 2019-03-31 DIAGNOSIS — M778 Other enthesopathies, not elsewhere classified: Secondary | ICD-10-CM | POA: Insufficient documentation

## 2019-03-31 DIAGNOSIS — M25541 Pain in joints of right hand: Secondary | ICD-10-CM | POA: Insufficient documentation

## 2019-03-31 MED ORDER — PREDNISONE 20 MG PO TABS: 20.0000 mg | ORAL_TABLET | Freq: Two times a day (BID) | ORAL | 0 refills | Status: AC

## 2019-03-31 MED ORDER — CLINDAMYCIN HCL 300 MG PO CAPS: 300.0000 mg | ORAL_CAPSULE | Freq: Three times a day (TID) | ORAL | 0 refills | Status: AC

## 2019-03-31 MED ORDER — CLINDAMYCIN HCL 150 MG PO CAPS
300.0000 mg | ORAL_CAPSULE | Freq: Once | ORAL | Status: AC
  Administered 2019-03-31: 300 mg via ORAL
  Filled 2019-03-31: qty 2

## 2019-03-31 MED ORDER — HYDROCODONE-ACETAMINOPHEN 5-325 MG PO TABS: 1.0000 | ORAL_TABLET | Freq: Three times a day (TID) | ORAL | 0 refills | Status: AC | PRN

## 2019-03-31 NOTE — ED Notes (Signed)
See triage note   States he developed pain to right middle finger couple of days ago  Woke up this am with swelling and pain  Denies any injury
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
## 2019-03-31 NOTE — Discharge Instructions (Addendum)
Your exam and XR are negative for any signs of fracture or dislocation. You have a tendinitis, which will be treated with antibiotics and steroids. You should also monitor your blood pressure. Set up primary care at [facility] for routine medical care.

## 2019-03-31 NOTE — ED Triage Notes (Signed)
Patient presents to the ED with a painful middle finger on his right hand since Saturday.  Patient denies known injury.  No obvious distress at this time.  Patient states pain is now going up right arm.  No obvious swelling or discoloration noted.

## 2019-09-08 ENCOUNTER — Emergency Department: Payer: No Typology Code available for payment source

## 2019-09-08 ENCOUNTER — Emergency Department
Admission: EM | Admit: 2019-09-08 | Discharge: 2019-09-08 | Disposition: A | Discharge status: Home / Self Care | Payer: No Typology Code available for payment source | Attending: Student in an Organized Health Care Education/Training Program | Admitting: Student in an Organized Health Care Education/Training Program

## 2019-09-08 ENCOUNTER — Other Ambulatory Visit: Payer: Self-pay

## 2019-09-08 ENCOUNTER — Encounter: Payer: Self-pay | Admitting: Emergency Medicine

## 2019-09-08 DIAGNOSIS — R0789 Other chest pain: Secondary | ICD-10-CM | POA: Diagnosis present

## 2019-09-08 DIAGNOSIS — Y9389 Activity, other specified: Secondary | ICD-10-CM | POA: Diagnosis not present

## 2019-09-08 DIAGNOSIS — Y999 Unspecified external cause status: Secondary | ICD-10-CM | POA: Insufficient documentation

## 2019-09-08 DIAGNOSIS — Y92410 Unspecified street and highway as the place of occurrence of the external cause: Secondary | ICD-10-CM | POA: Insufficient documentation

## 2019-09-08 DIAGNOSIS — F17210 Nicotine dependence, cigarettes, uncomplicated: Secondary | ICD-10-CM | POA: Insufficient documentation

## 2019-09-08 DIAGNOSIS — M7918 Myalgia, other site: Secondary | ICD-10-CM

## 2019-09-08 DIAGNOSIS — M791 Myalgia, unspecified site: Secondary | ICD-10-CM | POA: Diagnosis not present

## 2019-09-08 MED ORDER — HYDROCODONE-ACETAMINOPHEN 5-325 MG PO TABS
1.0000 | ORAL_TABLET | Freq: Once | ORAL | Status: AC
  Administered 2019-09-08: 1 via ORAL
  Filled 2019-09-08: qty 1

## 2019-09-08 MED ORDER — IBUPROFEN 800 MG PO TABS: 800.0000 mg | ORAL_TABLET | Freq: Three times a day (TID) | ORAL | 0 refills | Status: DC | PRN

## 2019-09-08 MED ORDER — HYDROCODONE-ACETAMINOPHEN 5-325 MG PO TABS: 1.0000 | ORAL_TABLET | Freq: Three times a day (TID) | ORAL | 0 refills | Status: AC | PRN

## 2019-09-08 MED ORDER — CYCLOBENZAPRINE HCL 5 MG PO TABS: 5.0000 mg | ORAL_TABLET | Freq: Three times a day (TID) | ORAL | 0 refills | Status: DC | PRN

## 2019-09-08 MED ORDER — IBUPROFEN 800 MG PO TABS
800.0000 mg | ORAL_TABLET | Freq: Once | ORAL | Status: AC
  Administered 2019-09-08: 800 mg via ORAL
  Filled 2019-09-08: qty 1
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
## 2019-09-08 NOTE — Discharge Instructions (Signed)
Take the prescription meds as directed. Apply ice and/or moist heat to any sore muscles. Follow-up with a local urgent care or return if needed.

## 2020-04-13 ENCOUNTER — Emergency Department: Payer: Self-pay

## 2020-04-13 ENCOUNTER — Other Ambulatory Visit: Payer: Self-pay

## 2020-04-13 ENCOUNTER — Emergency Department
Admission: EM | Admit: 2020-04-13 | Discharge: 2020-04-13 | Disposition: A | Discharge status: Home / Self Care | Payer: Self-pay | Attending: Emergency Medicine | Admitting: Emergency Medicine

## 2020-04-13 DIAGNOSIS — M7061 Trochanteric bursitis, right hip: Secondary | ICD-10-CM

## 2020-04-13 DIAGNOSIS — F17210 Nicotine dependence, cigarettes, uncomplicated: Secondary | ICD-10-CM | POA: Insufficient documentation

## 2020-04-13 DIAGNOSIS — Y9389 Activity, other specified: Secondary | ICD-10-CM | POA: Insufficient documentation

## 2020-04-13 MED ORDER — MELOXICAM 15 MG PO TABS: 15.0000 mg | ORAL_TABLET | Freq: Every day | ORAL | 2 refills | Status: DC

## 2020-04-13 MED ORDER — TRAMADOL HCL 50 MG PO TABS
50.0000 mg | ORAL_TABLET | Freq: Once | ORAL | Status: AC
  Administered 2020-04-13: 50 mg via ORAL
  Filled 2020-04-13: qty 1
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
## 2020-04-13 NOTE — ED Notes (Signed)
See triage note. Pt presents to the ED for R upper thigh pain. Pt states it has been bothering him since last night. Pt does do strenuous work. Pt is A&Ox4 and NAD. Pt states pain is 8/10

## 2020-04-13 NOTE — Discharge Instructions (Signed)
Apply ice to your hip.  Take the meloxicam daily as prescribed.  Return emergency department if worsening.  Follow-up with orthopedics if not improving within 1 week.  Phone number is attached.

## 2020-04-13 NOTE — ED Triage Notes (Signed)
Pt comes via POV from home with c/o right leg pain. Pt states it started hurting at work but got worse last night. Pt states achy pain. Pt denies any recent injuries.

## 2020-10-04 ENCOUNTER — Emergency Department
Admission: EM | Admit: 2020-10-04 | Discharge: 2020-10-04 | Disposition: A | Discharge status: Home / Self Care | Payer: Self-pay | Attending: Emergency Medicine | Admitting: Emergency Medicine

## 2020-10-04 ENCOUNTER — Encounter: Payer: Self-pay | Admitting: Emergency Medicine

## 2020-10-04 ENCOUNTER — Emergency Department: Payer: Self-pay

## 2020-10-04 ENCOUNTER — Other Ambulatory Visit: Payer: Self-pay

## 2020-10-04 DIAGNOSIS — F17210 Nicotine dependence, cigarettes, uncomplicated: Secondary | ICD-10-CM | POA: Insufficient documentation

## 2020-10-04 DIAGNOSIS — M7552 Bursitis of left shoulder: Secondary | ICD-10-CM | POA: Insufficient documentation

## 2020-10-04 DIAGNOSIS — R001 Bradycardia, unspecified: Secondary | ICD-10-CM | POA: Insufficient documentation

## 2020-10-04 MED ORDER — NAPROXEN 500 MG PO TABS: 500.0000 mg | ORAL_TABLET | Freq: Two times a day (BID) | ORAL | Status: AC

## 2020-10-04 MED ORDER — CLONIDINE HCL 0.1 MG PO TABS: 0.2000 mg | ORAL_TABLET | Freq: Once | ORAL | Status: DC

## 2020-10-04 MED ORDER — LIDOCAINE 5 % EX PTCH
1.0000 | MEDICATED_PATCH | CUTANEOUS | Status: DC
  Administered 2020-10-04: 1 via TRANSDERMAL
  Filled 2020-10-04: qty 1
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
## 2020-10-04 NOTE — ED Triage Notes (Signed)
Pt to ED via POV with c/o L shoulder pain since Saturday, pt states increased pain with movement at this time, states unable to lift L arm past passive position due to pain.

## 2020-10-04 NOTE — Discharge Instructions (Signed)
Read and follow discharge care instructions.  Wear Lidoderm patch for 12 hours.  Advised sling for 2 to 3 days as needed.  Take medication as directed.  Follow-up orthopedic if no improvement in 1 week.

## 2020-10-04 NOTE — ED Provider Notes (Signed)
Procedures     ----------------------------------------- 7:53 AM on 10/04/2020 ----------------------------------------- EKG interpreted by me Sinus bradycardia rate of 58.  Normal axis and intervals QRS ST segments and T waves.  No acute ischemic changes.     Sharman Cheek, MD 10/04/20 [phone]

## 2020-10-04 NOTE — ED Notes (Signed)
Se triage note  Presents with left shoulder pain  States he developed some soreness to left shoulder on Saturday  Denies any injury  States pain is non radiating
